# Patient Record
Sex: Male | Born: 1945 | Race: White | Hispanic: No | Marital: Single | Smoking: Never smoker
Health system: Southern US, Community
[De-identification: ages and names within clinical notes are randomized; demographics above are authoritative.]

---

## 1998-05-08 ENCOUNTER — Encounter: Payer: Self-pay | Admitting: Internal Medicine

## 1998-05-08 ENCOUNTER — Ambulatory Visit (HOSPITAL_COMMUNITY): Admission: RE | Admit: 1998-05-08 | Discharge: 1998-05-08 | Payer: Self-pay | Admitting: Internal Medicine

## 2004-11-19 ENCOUNTER — Encounter: Admission: RE | Admit: 2004-11-19 | Discharge: 2004-11-19 | Payer: Self-pay | Admitting: Internal Medicine

## 2010-09-20 ENCOUNTER — Other Ambulatory Visit: Payer: Self-pay | Admitting: Internal Medicine

## 2010-09-20 ENCOUNTER — Ambulatory Visit
Admission: RE | Admit: 2010-09-20 | Discharge: 2010-09-20 | Disposition: A | Discharge status: Home / Self Care | Payer: 59 | Source: Ambulatory Visit | Attending: Internal Medicine | Admitting: Internal Medicine

## 2010-09-20 DIAGNOSIS — R52 Pain, unspecified: Secondary | ICD-10-CM

## 2011-08-11 DIAGNOSIS — H0230 Blepharochalasis unspecified eye, unspecified eyelid: Secondary | ICD-10-CM | POA: Diagnosis not present

## 2011-08-11 DIAGNOSIS — H2510 Age-related nuclear cataract, unspecified eye: Secondary | ICD-10-CM | POA: Diagnosis not present

## 2011-11-14 DIAGNOSIS — Z23 Encounter for immunization: Secondary | ICD-10-CM | POA: Diagnosis not present

## 2012-07-09 DIAGNOSIS — E785 Hyperlipidemia, unspecified: Secondary | ICD-10-CM | POA: Diagnosis not present

## 2012-07-09 DIAGNOSIS — R7301 Impaired fasting glucose: Secondary | ICD-10-CM | POA: Diagnosis not present

## 2012-07-09 DIAGNOSIS — Z125 Encounter for screening for malignant neoplasm of prostate: Secondary | ICD-10-CM | POA: Diagnosis not present

## 2012-07-09 DIAGNOSIS — M899 Disorder of bone, unspecified: Secondary | ICD-10-CM | POA: Diagnosis not present

## 2012-07-09 DIAGNOSIS — M949 Disorder of cartilage, unspecified: Secondary | ICD-10-CM | POA: Diagnosis not present

## 2012-07-10 DIAGNOSIS — H6120 Impacted cerumen, unspecified ear: Secondary | ICD-10-CM | POA: Diagnosis not present

## 2012-07-10 DIAGNOSIS — J309 Allergic rhinitis, unspecified: Secondary | ICD-10-CM | POA: Diagnosis not present

## 2012-07-10 DIAGNOSIS — Z0000 Encounter for general adult medical examination without abnormal findings: Secondary | ICD-10-CM | POA: Diagnosis not present

## 2012-07-10 DIAGNOSIS — E785 Hyperlipidemia, unspecified: Secondary | ICD-10-CM | POA: Diagnosis not present

## 2012-07-10 DIAGNOSIS — E291 Testicular hypofunction: Secondary | ICD-10-CM | POA: Diagnosis not present

## 2012-07-10 DIAGNOSIS — Z23 Encounter for immunization: Secondary | ICD-10-CM | POA: Diagnosis not present

## 2012-07-10 DIAGNOSIS — M899 Disorder of bone, unspecified: Secondary | ICD-10-CM | POA: Diagnosis not present

## 2012-07-10 DIAGNOSIS — R7301 Impaired fasting glucose: Secondary | ICD-10-CM | POA: Diagnosis not present

## 2012-07-10 DIAGNOSIS — M949 Disorder of cartilage, unspecified: Secondary | ICD-10-CM | POA: Diagnosis not present

## 2012-07-10 DIAGNOSIS — I459 Conduction disorder, unspecified: Secondary | ICD-10-CM | POA: Diagnosis not present

## 2012-10-31 DIAGNOSIS — M25569 Pain in unspecified knee: Secondary | ICD-10-CM | POA: Diagnosis not present

## 2012-11-13 DIAGNOSIS — M899 Disorder of bone, unspecified: Secondary | ICD-10-CM | POA: Diagnosis not present

## 2013-03-12 DIAGNOSIS — L678 Other hair color and hair shaft abnormalities: Secondary | ICD-10-CM | POA: Diagnosis not present

## 2013-03-12 DIAGNOSIS — L259 Unspecified contact dermatitis, unspecified cause: Secondary | ICD-10-CM | POA: Diagnosis not present

## 2013-03-12 DIAGNOSIS — L738 Other specified follicular disorders: Secondary | ICD-10-CM | POA: Diagnosis not present

## 2013-03-12 DIAGNOSIS — Z6829 Body mass index (BMI) 29.0-29.9, adult: Secondary | ICD-10-CM | POA: Diagnosis not present

## 2013-05-07 IMAGING — CR DG RIBS 2V*L*
2 series · 2 of 2 positions shown · non-contrast
Comparison: Chest x-ray of 11/19/2004

CLINICAL DATA: Slipped and fell 1 week ago with pain in the left
lower ribs

LEFT RIBS - 2 VIEW

[view not recorded (1 of 2)]
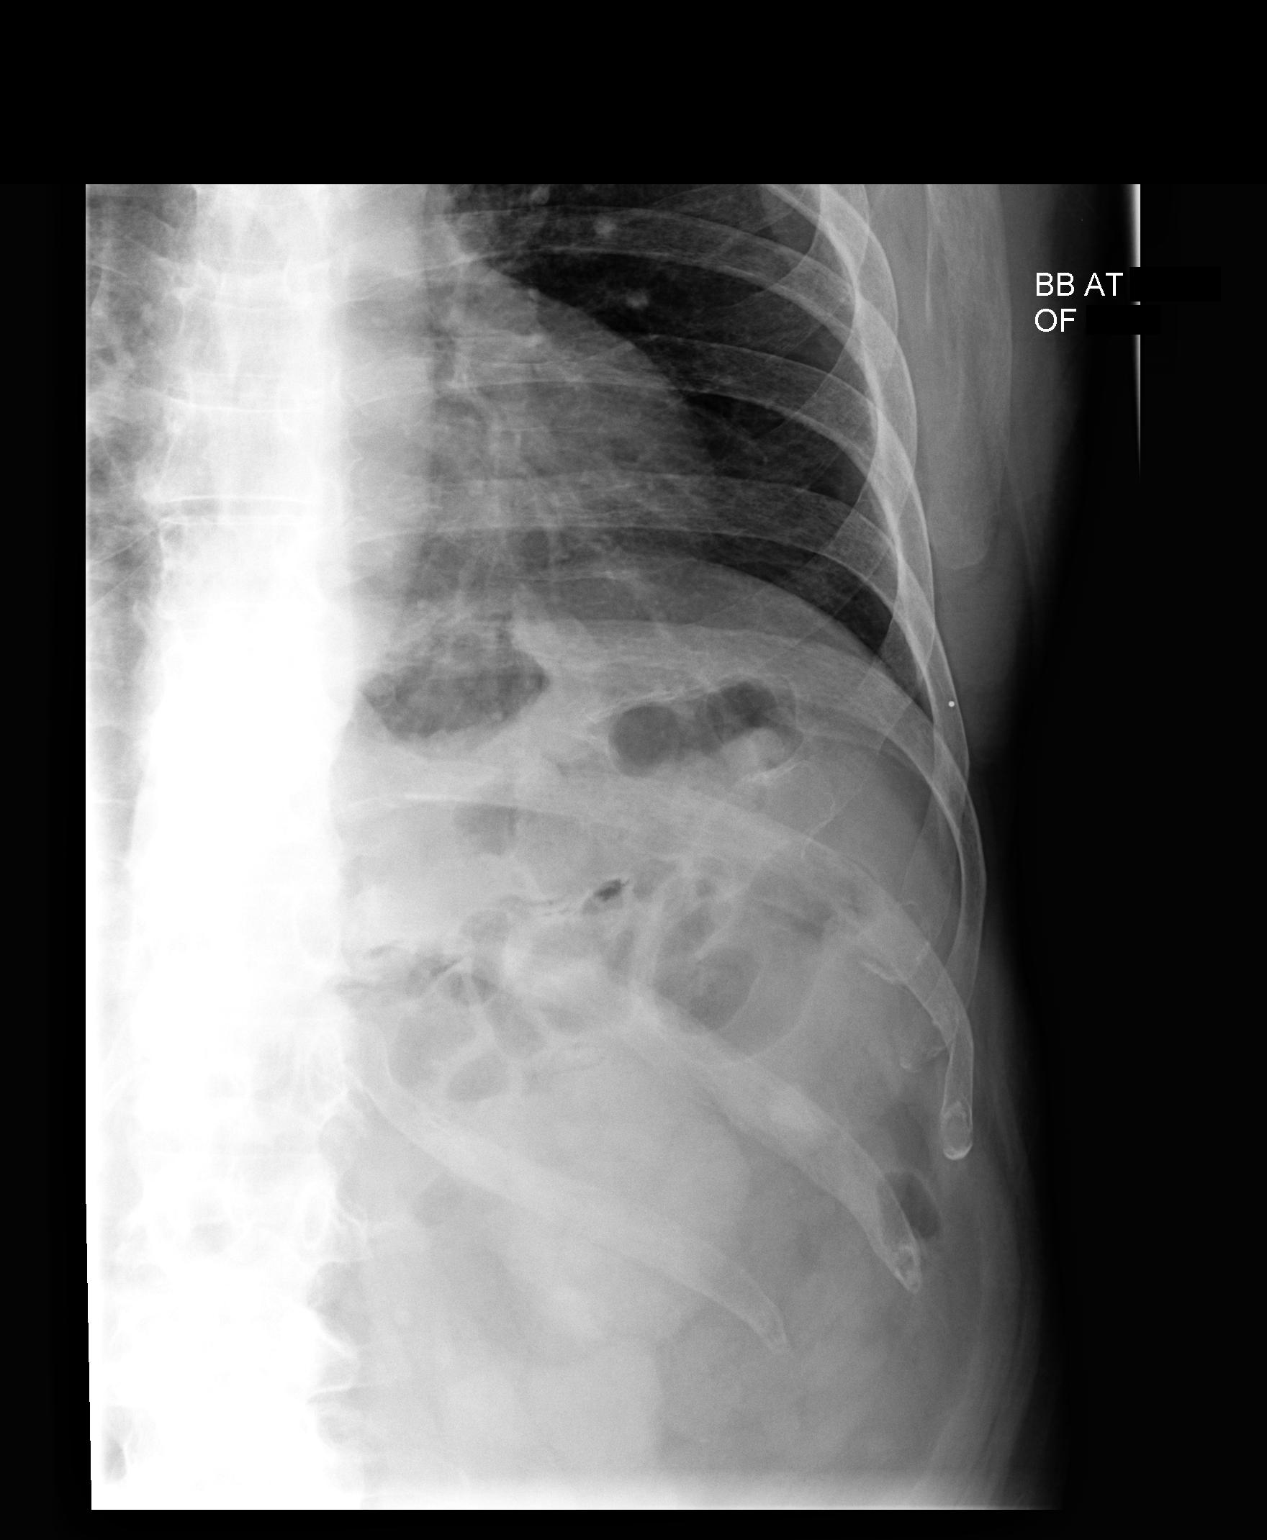

[view not recorded (2 of 2)]
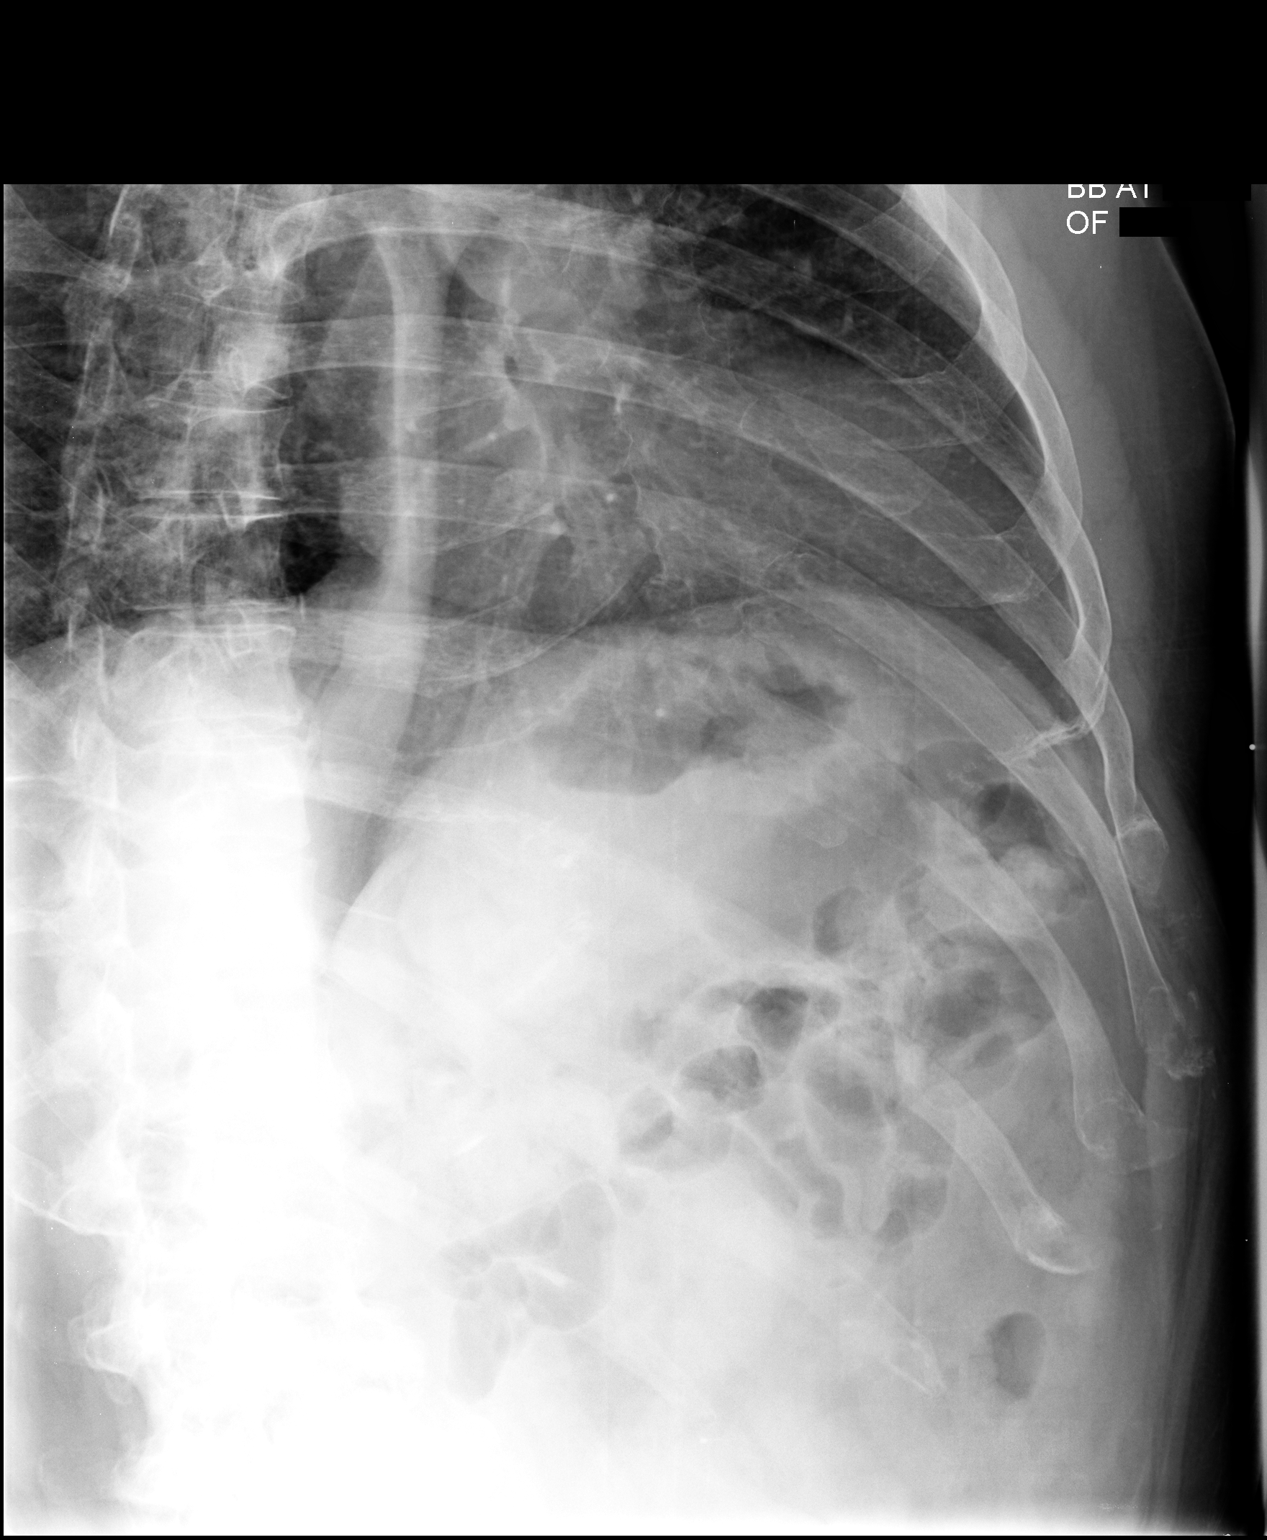

[2 of 2 positions shown; findings below may reference images not displayed]

FINDINGS: There is a nondisplaced fracture of the anterior left
seventh rib and possibly the anterior left tenth rib.  No other
acute bony abnormality is seen.
IMPRESSION: Fracture of the anterior left seventh and possibly anterior left
tenth ribs.

## 2013-09-06 DIAGNOSIS — H02839 Dermatochalasis of unspecified eye, unspecified eyelid: Secondary | ICD-10-CM | POA: Diagnosis not present

## 2013-09-06 DIAGNOSIS — H2510 Age-related nuclear cataract, unspecified eye: Secondary | ICD-10-CM | POA: Diagnosis not present

## 2013-10-22 DIAGNOSIS — E785 Hyperlipidemia, unspecified: Secondary | ICD-10-CM | POA: Diagnosis not present

## 2013-10-22 DIAGNOSIS — Z125 Encounter for screening for malignant neoplasm of prostate: Secondary | ICD-10-CM | POA: Diagnosis not present

## 2013-10-22 DIAGNOSIS — M899 Disorder of bone, unspecified: Secondary | ICD-10-CM | POA: Diagnosis not present

## 2013-10-22 DIAGNOSIS — M949 Disorder of cartilage, unspecified: Secondary | ICD-10-CM | POA: Diagnosis not present

## 2013-10-22 DIAGNOSIS — R7301 Impaired fasting glucose: Secondary | ICD-10-CM | POA: Diagnosis not present

## 2013-10-22 DIAGNOSIS — Z79899 Other long term (current) drug therapy: Secondary | ICD-10-CM | POA: Diagnosis not present

## 2013-10-29 DIAGNOSIS — E669 Obesity, unspecified: Secondary | ICD-10-CM | POA: Diagnosis not present

## 2013-10-29 DIAGNOSIS — M949 Disorder of cartilage, unspecified: Secondary | ICD-10-CM | POA: Diagnosis not present

## 2013-10-29 DIAGNOSIS — Z0000 Encounter for general adult medical examination without abnormal findings: Secondary | ICD-10-CM | POA: Diagnosis not present

## 2013-10-29 DIAGNOSIS — Z1331 Encounter for screening for depression: Secondary | ICD-10-CM | POA: Diagnosis not present

## 2013-10-29 DIAGNOSIS — M899 Disorder of bone, unspecified: Secondary | ICD-10-CM | POA: Diagnosis not present

## 2013-10-29 DIAGNOSIS — Z23 Encounter for immunization: Secondary | ICD-10-CM | POA: Diagnosis not present

## 2013-10-29 DIAGNOSIS — L259 Unspecified contact dermatitis, unspecified cause: Secondary | ICD-10-CM | POA: Diagnosis not present

## 2013-10-29 DIAGNOSIS — E785 Hyperlipidemia, unspecified: Secondary | ICD-10-CM | POA: Diagnosis not present

## 2013-10-29 DIAGNOSIS — I459 Conduction disorder, unspecified: Secondary | ICD-10-CM | POA: Diagnosis not present

## 2013-10-29 DIAGNOSIS — R7301 Impaired fasting glucose: Secondary | ICD-10-CM | POA: Diagnosis not present

## 2013-12-30 ENCOUNTER — Encounter: Payer: Self-pay | Admitting: Cardiovascular Disease

## 2013-12-30 ENCOUNTER — Ambulatory Visit (INDEPENDENT_AMBULATORY_CARE_PROVIDER_SITE_OTHER): Payer: Medicare Other | Admitting: Cardiovascular Disease

## 2013-12-30 VITALS — BP 130/68 | HR 55 | Ht 68.0 in | Wt 199.9 lb

## 2013-12-30 DIAGNOSIS — R0600 Dyspnea, unspecified: Secondary | ICD-10-CM | POA: Diagnosis not present

## 2013-12-30 DIAGNOSIS — R0602 Shortness of breath: Secondary | ICD-10-CM

## 2013-12-30 DIAGNOSIS — R9431 Abnormal electrocardiogram [ECG] [EKG]: Secondary | ICD-10-CM

## 2013-12-30 DIAGNOSIS — I440 Atrioventricular block, first degree: Secondary | ICD-10-CM

## 2013-12-30 DIAGNOSIS — I447 Left bundle-branch block, unspecified: Secondary | ICD-10-CM | POA: Insufficient documentation

## 2013-12-30 NOTE — Progress Notes (Signed)
Patient ID: Guinevere ScarletFred D Raineri, male   DOB: 12/04/1945, 68 y.o.   MRN: [id number]     Reason for office visit Intraventricular conduction delay  Mr. Ezzard Standingewman is a 68 year old gentleman without history of structural heart disease referred by Dr. Waynard EdwardsPerini for electrocardiographic abnormalities. When he was 68 years old he was refused enrollment in the [employer] for what sounds like first-degree AV block. This was present on an ECG from 2009.   He has not had electrocardiograms since then until the one performed in September which showed a broad QRS complex with a duration of 137 ms and marked left axis deviation. There continues to be a prominent first-degree AV block with a PR interval that now measures well over 300 ms. The electrocardiogram in our office shows very similar findings. The appearance is that of a slightly atypical left bundle branch block with a small Q wave in aVL and extreme left axis deviation at -70. On the current ECG the PR interval measures 286 ms and the QRS measures 132 ms.  He denies angina, dizziness, presyncope or syncope and palpitations. He walks 20-30 minutes at a time 4 days a week. He does not volunteer problems with shortness of breath but upon more detailed questioning he seems to have had some reduction in functional status over the years.  He does not have hypertension, diabetes mellitus, hyperlipidemia and does not appear to have a family history of premature coronary disease. Both his parents lived to be around 68 years old and died from cancer. His father had poorly defined heart disease around the age of 68 lived to be 68 years old. He has a sister age 68 and 3 children who are all in good health.   No Known Allergies  Current Outpatient Prescriptions  Medication Sig Dispense Refill  . aspirin 81 MG tablet Take 81 mg by mouth daily.    . Cholecalciferol (VITAMIN D) 2000 UNITS tablet Take 2,000 Units by mouth daily.    . Multiple Vitamin (MULTIVITAMIN) tablet Take 1  tablet by mouth daily.    Marland Kitchen. omeprazole (PRILOSEC) 20 MG capsule Take 20 mg by mouth daily.  4  . rosuvastatin (CRESTOR) 10 MG tablet Take 10 mg by mouth daily.     No current facility-administered medications for this visit.    No past medical history on file.  No past surgical history on file.  No family history on file.  History   Social History  . Marital Status: Single    Spouse Name: N/A    Number of Children: N/A  . Years of Education: N/A   Occupational History  . Not on file.   Social History Main Topics  . Smoking status: Never Smoker   . Smokeless tobacco: Never Used  . Alcohol Use: No  . Drug Use: No  . Sexual Activity: Not on file   Other Topics Concern  . Not on file   Social History Narrative  . No narrative on file    Review of systems: The patient specifically denies any chest pain at rest or with exertion, dyspnea at rest, orthopnea, paroxysmal nocturnal dyspnea, syncope, palpitations, focal neurological deficits, intermittent claudication, lower extremity edema, unexplained weight gain, cough, hemoptysis or wheezing.  The patient also denies abdominal pain, nausea, vomiting, dysphagia, diarrhea, constipation, polyuria, polydipsia, dysuria, hematuria, frequency, urgency, abnormal bleeding or bruising, fever, chills, unexpected weight changes, mood swings, change in skin or hair texture, change in voice quality, auditory or visual problems, allergic reactions or  rashes, new musculoskeletal complaints other than usual "aches and pains".   PHYSICAL EXAM BP 130/68 mmHg  Pulse 55  Ht 5\' 8"  (1.727 m)  Wt 199 lb 14.4 oz (90.674 kg)  BMI 30.40 kg/m2  General: Alert, oriented x3, no distress Head: no evidence of trauma, PERRL, EOMI, no exophtalmos or lid lag, no myxedema, no xanthelasma; normal ears, nose and oropharynx Neck: normal jugular venous pulsations and no hepatojugular reflux; brisk carotid pulses without delay and no carotid bruits Chest: clear  to auscultation, no signs of consolidation by percussion or palpation, normal fremitus, symmetrical and full respiratory excursions Cardiovascular: normal position and quality of the apical impulse, regular rhythm, normal first and paradoxically split second heart sounds, no murmurs, rubs or gallops Abdomen: no tenderness or distention, no masses by palpation, no abnormal pulsatility or arterial bruits, normal bowel sounds, no hepatosplenomegaly Extremities: no clubbing, cyanosis or edema; 2+ radial, ulnar and brachial pulses bilaterally; 2+ right femoral, posterior tibial and dorsalis pedis pulses; 2+ left femoral, posterior tibial and dorsalis pedis pulses; no subclavian or femoral bruits Neurological: grossly nonfocal   EKG: Sinus rhythm, long first-degree AV block, atypical left bundle branch block, QRS 132 ms, PR 286 ms, QTC 394 ms  Lipid Panel  Total cholesterol 151, triglycerides 87, HDL 48, LDL 86 Hemoglobin 16.115.5, potassium 4.5, creatinine 0.8, normal liver function tests, normal TSH, hemoglobin A1c 5.5%  BMET No results found for: NA, K, CL, CO2, GLUCOSE, BUN, CREATININE, CALCIUM, GFRNONAA, GFRAA   ASSESSMENT AND PLAN  Mr. 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 He has not had syncope or other symptoms to suggest need for pacemaker therapy. There has been subtle reduction in functional capacity and I have recommended that he have an echocardiogram and a functional study to assess for coronary disease. Since he has a broad left bundle branch lock type conduction abnormality, conventional stress testing will not suffice and he needs to have pharmacological stress testing.  If both the echo and the stress test showed normal findings continue with observation, counseling him to seek care if he develops symptoms of bradycardia.  Orders Placed This Encounter  Procedures  . Myocardial Perfusion Imaging  . EKG 12-Lead  . 2D  Echocardiogram without contrast   Meds ordered this encounter  Medications  . omeprazole (PRILOSEC) 20 MG capsule    Sig: Take 20 mg by mouth daily.    Refill:  4  . rosuvastatin (CRESTOR) 10 MG tablet    Sig: Take 10 mg by mouth daily.  Marland Kitchen. aspirin 81 MG tablet    Sig: Take 81 mg by mouth daily.  . Cholecalciferol (VITAMIN D) 2000 UNITS tablet    Sig: Take 2,000 Units by mouth daily.  . Multiple Vitamin (MULTIVITAMIN) tablet    Sig: Take 1 tablet by mouth daily.    Junious SilkROITORU,Janara Klett  Fransheska Willingham, MD, [facility] [phone] office [phone] pager

## 2013-12-30 NOTE — Patient Instructions (Signed)
Your physician has requested that you have an echocardiogram. Echocardiography is a painless test that uses sound waves to create images of your heart. It provides your doctor with information about the size and shape of your heart and how well your heart's chambers and valves are working. This procedure takes approximately one hour. There are no restrictions for this procedure.  Your physician has requested that you have a lexiscan myoview. For further information please visit [email or web address]. Please follow instruction sheet, as given.  Dr. Royann Shiversroitoru recommends that you schedule a follow-up appointment in: after testing.

## 2014-01-08 ENCOUNTER — Encounter: Payer: Self-pay | Admitting: Cardiovascular Disease

## 2014-01-16 ENCOUNTER — Telehealth (HOSPITAL_COMMUNITY): Payer: Self-pay

## 2014-01-16 NOTE — Telephone Encounter (Signed)
Encounter complete. 

## 2014-01-21 ENCOUNTER — Ambulatory Visit (HOSPITAL_COMMUNITY)
Admission: RE | Admit: 2014-01-21 | Discharge: 2014-01-21 | Disposition: A | Discharge status: Home / Self Care | Payer: Medicare Other | Source: Ambulatory Visit | Attending: Cardiology | Admitting: Cardiology

## 2014-01-21 ENCOUNTER — Ambulatory Visit (HOSPITAL_BASED_OUTPATIENT_CLINIC_OR_DEPARTMENT_OTHER)
Admission: RE | Admit: 2014-01-21 | Discharge: 2014-01-21 | Disposition: A | Discharge status: Home / Self Care | Payer: Medicare Other | Source: Ambulatory Visit | Attending: Cardiovascular Disease | Admitting: Cardiovascular Disease

## 2014-01-21 DIAGNOSIS — R0602 Shortness of breath: Secondary | ICD-10-CM | POA: Diagnosis not present

## 2014-01-21 DIAGNOSIS — R9431 Abnormal electrocardiogram [ECG] [EKG]: Secondary | ICD-10-CM

## 2014-01-21 DIAGNOSIS — Z8249 Family history of ischemic heart disease and other diseases of the circulatory system: Secondary | ICD-10-CM | POA: Diagnosis not present

## 2014-01-21 DIAGNOSIS — E785 Hyperlipidemia, unspecified: Secondary | ICD-10-CM | POA: Diagnosis not present

## 2014-01-21 DIAGNOSIS — R0609 Other forms of dyspnea: Secondary | ICD-10-CM | POA: Insufficient documentation

## 2014-01-21 DIAGNOSIS — I359 Nonrheumatic aortic valve disorder, unspecified: Secondary | ICD-10-CM | POA: Diagnosis not present

## 2014-01-21 MED ORDER — TECHNETIUM TC 99M SESTAMIBI GENERIC - CARDIOLITE
32.0000 | Freq: Once | INTRAVENOUS | Status: AC | PRN
  Administered 2014-01-21: 32 via INTRAVENOUS

## 2014-01-21 MED ORDER — TECHNETIUM TC 99M SESTAMIBI GENERIC - CARDIOLITE
10.9000 | Freq: Once | INTRAVENOUS | Status: AC | PRN
  Administered 2014-01-21: 10.9 via INTRAVENOUS

## 2014-01-21 MED ORDER — AMINOPHYLLINE 25 MG/ML IV SOLN
125.0000 mg | Freq: Once | INTRAVENOUS | Status: AC
  Administered 2014-01-21: 125 mg via INTRAVENOUS

## 2014-01-21 MED ORDER — REGADENOSON 0.4 MG/5ML IV SOLN
0.4000 mg | Freq: Once | INTRAVENOUS | Status: AC
  Administered 2014-01-21: 0.4 mg via INTRAVENOUS

## 2014-01-21 NOTE — Procedures (Addendum)
[facility] [address] [place] [address] [phone]  Cardiology Nuclear Med Study  Billy Vega is a 68 y.o. male     MRN : [id number]     DOB: 09/09/1945  Procedure Date: 01/21/2014  Nuclear Med Background Indication for Stress Test:  Evaluation for Ischemia and Abnormal EKG History:  No prior cardiac or respiratory history reported;No prior NUC MPI for comparisopn. Cardiac Risk Factors: Family History - CAD and Lipids  Symptoms:  DOE   Nuclear Pre-Procedure Caffeine/Decaff Intake:  8:00pm NPO After: 6:00am   IV Site: R Forearm  IV 0.9% NS with Angio Cath:  22g  Chest Size (in):  **46"  IV Started by: Berdie OgrenAmanda Wease, RN  Height: 5\' 8"  (1.727 m)  Cup Size: n/a  BMI:  Body mass index is 30.26 kg/(m^2). Weight:  199 lb (90.266 kg)   Tech Comments:  n/a    Nuclear Med Study 1 or 2 day study: 1 day  Stress Test Type:  Lexiscan  Order Authorizing Provider:  Laddie AquasMihaai Croitoru, MD   Resting Radionuclide: Technetium 3257m Sestamibi  Resting Radionuclide Dose: 10.9 mCi   Stress Radionuclide:  Technetium 2957m Sestamibi  Stress Radionuclide Dose: 32.0 mCi           Stress Protocol Rest HR:45 Stress HR: 70  Rest BP: 138/81 Stress BP: 141/77  Exercise Time (min): n/a METS: n/a          Dose of Adenosine (mg):  n/a Dose of Lexiscan: 0.4 mg  Dose of Atropine (mg): n/a Dose of Dobutamine: n/a mcg/kg/min (at max HR)  Stress Test Technologist: Ernestene MentionGwen Farrington, CCT Nuclear Technologist: Gonzella LexPam Phillips, CNMT   Rest Procedure:  Myocardial perfusion imaging was performed at rest 45 minutes following the intravenous administration of Technetium 2857m Sestamibi. Stress Procedure:  The patient received IV Lexiscan 0.4 mg over 15-seconds.  Technetium 7557m Sestamibi injected Iv at 30-seconds.  Patient experienced shortness of breath and was administered 125 mg of Aminophylline IV. There were no significant changes with Lexiscan.   Quantitative spect images were obtained after a 45 minute delay.  Transient Ischemic Dilatation (Normal <1.22):  1.10  QGS EDV:  110 ml QGS ESV:  48 ml LV Ejection Fraction: 56%  PHYSICIAN ATTESTATION.  Rest ECG: NSR-LBBB  Stress ECG: No significant change from baseline ECG and No significant ST segment change suggestive of ischemia.  QPS Raw Data Images:  Mild diaphragmatic attenuation.  Normal left ventricular size.  Increased subdiaphragmatic (splanchnic) visceral / hepatic tracer uptake obscures the apical inferoseptal wall. Stress Images:  There is decreased uptake in the apex. Fixed Small to medium sized, moderate intensity, mostly fixed apical, inferoapical, apical septal defect without reversibility. Rest Images:  There is decreased uptake in the apex.  LV Wall Motion:  NL LV Function; NL Wall Motion  Subtraction (SDS):  There is a fixed inferior - inferoapical defect that is most consistent with diaphragmatic attenuation.  No reversibility is appreciated & normal inferoapical wall motion - cannot exclude prior inferoapical infarction.  There is no evidence of ischemia.   Impression Exercise Capacity:  Lexiscan with no exercise. BP Response:  Normal blood pressure response. Clinical Symptoms:  There is dyspnea. ECG Impression:  No significant ECG changes with Lexiscan. Comparison with Prior Nuclear Study: No images to compare  Overall Impression:  Low risk stress nuclear study fixed apical defect - most consistent with diaphragmatic attenuation.Marland Kitchen.   Marykay LexHARDING, DAVID W, MD  01/21/2014 1:07 PM

## 2014-01-21 NOTE — Progress Notes (Signed)
2D Echo Performed 01/21/2014    Billy Vega, RCS  

## 2014-01-27 ENCOUNTER — Encounter: Payer: Self-pay | Admitting: Cardiovascular Disease

## 2014-03-05 DIAGNOSIS — Z6829 Body mass index (BMI) 29.0-29.9, adult: Secondary | ICD-10-CM | POA: Diagnosis not present

## 2014-03-05 DIAGNOSIS — J029 Acute pharyngitis, unspecified: Secondary | ICD-10-CM | POA: Diagnosis not present

## 2014-03-05 DIAGNOSIS — R531 Weakness: Secondary | ICD-10-CM | POA: Diagnosis not present

## 2014-03-05 DIAGNOSIS — R55 Syncope and collapse: Secondary | ICD-10-CM | POA: Diagnosis not present

## 2014-07-11 DIAGNOSIS — Z6830 Body mass index (BMI) 30.0-30.9, adult: Secondary | ICD-10-CM | POA: Diagnosis not present

## 2014-07-11 DIAGNOSIS — M7989 Other specified soft tissue disorders: Secondary | ICD-10-CM | POA: Diagnosis not present

## 2014-07-11 DIAGNOSIS — S8992XA Unspecified injury of left lower leg, initial encounter: Secondary | ICD-10-CM | POA: Diagnosis not present

## 2014-07-11 DIAGNOSIS — S8012XA Contusion of left lower leg, initial encounter: Secondary | ICD-10-CM | POA: Diagnosis not present

## 2014-10-20 DIAGNOSIS — K210 Gastro-esophageal reflux disease with esophagitis: Secondary | ICD-10-CM | POA: Diagnosis not present

## 2014-10-20 DIAGNOSIS — E785 Hyperlipidemia, unspecified: Secondary | ICD-10-CM | POA: Diagnosis not present

## 2014-10-20 DIAGNOSIS — Z0001 Encounter for general adult medical examination with abnormal findings: Secondary | ICD-10-CM | POA: Diagnosis not present

## 2014-10-20 DIAGNOSIS — Z23 Encounter for immunization: Secondary | ICD-10-CM | POA: Diagnosis not present

## 2014-10-20 DIAGNOSIS — G250 Essential tremor: Secondary | ICD-10-CM | POA: Diagnosis not present

## 2014-10-20 DIAGNOSIS — Z1159 Encounter for screening for other viral diseases: Secondary | ICD-10-CM | POA: Diagnosis not present

## 2014-12-02 DIAGNOSIS — H02834 Dermatochalasis of left upper eyelid: Secondary | ICD-10-CM | POA: Diagnosis not present

## 2014-12-02 DIAGNOSIS — H10023 Other mucopurulent conjunctivitis, bilateral: Secondary | ICD-10-CM | POA: Diagnosis not present

## 2014-12-02 DIAGNOSIS — H02831 Dermatochalasis of right upper eyelid: Secondary | ICD-10-CM | POA: Diagnosis not present

## 2014-12-02 DIAGNOSIS — H2513 Age-related nuclear cataract, bilateral: Secondary | ICD-10-CM | POA: Diagnosis not present

## 2014-12-02 DIAGNOSIS — B308 Other viral conjunctivitis: Secondary | ICD-10-CM | POA: Diagnosis not present

## 2014-12-02 DIAGNOSIS — B303 Acute epidemic hemorrhagic conjunctivitis (enteroviral): Secondary | ICD-10-CM | POA: Diagnosis not present

## 2014-12-29 DIAGNOSIS — H269 Unspecified cataract: Secondary | ICD-10-CM | POA: Diagnosis not present

## 2014-12-29 DIAGNOSIS — H40003 Preglaucoma, unspecified, bilateral: Secondary | ICD-10-CM | POA: Diagnosis not present

## 2015-10-30 DIAGNOSIS — H6123 Impacted cerumen, bilateral: Secondary | ICD-10-CM | POA: Diagnosis not present

## 2015-10-30 DIAGNOSIS — G250 Essential tremor: Secondary | ICD-10-CM | POA: Diagnosis not present

## 2015-10-30 DIAGNOSIS — E785 Hyperlipidemia, unspecified: Secondary | ICD-10-CM | POA: Diagnosis not present

## 2015-10-30 DIAGNOSIS — E663 Overweight: Secondary | ICD-10-CM | POA: Diagnosis not present

## 2015-10-30 DIAGNOSIS — K210 Gastro-esophageal reflux disease with esophagitis: Secondary | ICD-10-CM | POA: Diagnosis not present

## 2015-10-30 DIAGNOSIS — Z6830 Body mass index (BMI) 30.0-30.9, adult: Secondary | ICD-10-CM | POA: Diagnosis not present

## 2015-10-30 DIAGNOSIS — Z0001 Encounter for general adult medical examination with abnormal findings: Secondary | ICD-10-CM | POA: Diagnosis not present

## 2015-10-30 DIAGNOSIS — Z23 Encounter for immunization: Secondary | ICD-10-CM | POA: Diagnosis not present

## 2015-12-29 DIAGNOSIS — H40003 Preglaucoma, unspecified, bilateral: Secondary | ICD-10-CM | POA: Diagnosis not present

## 2015-12-29 DIAGNOSIS — H25813 Combined forms of age-related cataract, bilateral: Secondary | ICD-10-CM | POA: Diagnosis not present

## 2016-01-17 DIAGNOSIS — Z6828 Body mass index (BMI) 28.0-28.9, adult: Secondary | ICD-10-CM | POA: Diagnosis not present

## 2016-01-17 DIAGNOSIS — R05 Cough: Secondary | ICD-10-CM | POA: Diagnosis not present

## 2016-01-17 DIAGNOSIS — J208 Acute bronchitis due to other specified organisms: Secondary | ICD-10-CM | POA: Diagnosis not present

## 2016-10-28 DIAGNOSIS — Z6829 Body mass index (BMI) 29.0-29.9, adult: Secondary | ICD-10-CM | POA: Diagnosis not present

## 2016-10-28 DIAGNOSIS — H6123 Impacted cerumen, bilateral: Secondary | ICD-10-CM | POA: Diagnosis not present

## 2016-10-28 DIAGNOSIS — K219 Gastro-esophageal reflux disease without esophagitis: Secondary | ICD-10-CM | POA: Diagnosis not present

## 2016-10-28 DIAGNOSIS — G250 Essential tremor: Secondary | ICD-10-CM | POA: Diagnosis not present

## 2016-10-28 DIAGNOSIS — R0789 Other chest pain: Secondary | ICD-10-CM | POA: Diagnosis not present

## 2016-10-28 DIAGNOSIS — Z23 Encounter for immunization: Secondary | ICD-10-CM | POA: Diagnosis not present

## 2016-11-23 DIAGNOSIS — Z6829 Body mass index (BMI) 29.0-29.9, adult: Secondary | ICD-10-CM | POA: Diagnosis not present

## 2016-11-23 DIAGNOSIS — H6123 Impacted cerumen, bilateral: Secondary | ICD-10-CM | POA: Diagnosis not present

## 2017-01-02 DIAGNOSIS — H25813 Combined forms of age-related cataract, bilateral: Secondary | ICD-10-CM | POA: Diagnosis not present

## 2017-01-02 DIAGNOSIS — H40003 Preglaucoma, unspecified, bilateral: Secondary | ICD-10-CM | POA: Diagnosis not present
